# Patient Record
Sex: Female | Born: 2000 | Race: Black or African American | Hispanic: No | Marital: Single | Smoking: Never smoker
Health system: Southern US, Community
[De-identification: ages and names within clinical notes are randomized; demographics above are authoritative.]

---
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
## 2019-04-07 ENCOUNTER — Ambulatory Visit: Payer: Self-pay | Attending: Internal Medicine

## 2019-04-07 DIAGNOSIS — Z23 Encounter for immunization: Secondary | ICD-10-CM

## 2019-04-07 NOTE — Progress Notes (Signed)
   Covid-19 Vaccination Clinic  Name:  Molly Gallagher    MRN: [id number] DOB: [birth year]  04/07/2019  [name] was observed post Covid-19 immunization for 15 minutes without incident. She was provided with Vaccine Information Sheet and instruction to access the V-Safe system.   [name] was instructed to call 911 with any severe reactions post vaccine: Marland Kitchen Difficulty breathing  . Swelling of face and throat  . A fast heartbeat  . A bad rash all over body  . Dizziness and weakness   Immunizations Administered    Name Date Dose VIS Date Route   Pfizer COVID-19 Vaccine 04/07/2019  2:31 PM 0.3 mL 12/18/2018 Intramuscular   Manufacturer: [employer], [employer]   Lot: [id number]   NDC: [address]-2

## 2019-04-09 ENCOUNTER — Ambulatory Visit: Payer: Self-pay

## 2019-05-02 ENCOUNTER — Ambulatory Visit: Payer: Self-pay | Attending: Internal Medicine

## 2019-05-02 DIAGNOSIS — Z23 Encounter for immunization: Secondary | ICD-10-CM

## 2019-05-02 NOTE — Progress Notes (Signed)
   Covid-19 Vaccination Clinic  Name:  Molly Gallagher    MRN: [id number] DOB: 15-Oct-2000  05/02/2019  Molly Gallagher was observed post Covid-19 immunization for 15 minutes without incident. She was provided with Vaccine Information Sheet and instruction to access the V-Safe system.   Molly Gallagher was instructed to call 911 with any severe reactions post vaccine: Marland Kitchen Difficulty breathing  . Swelling of face and throat  . A fast heartbeat  . A bad rash all over body  . Dizziness and weakness   Immunizations Administered    Name Date Dose VIS Date Route   Pfizer COVID-19 Vaccine 05/02/2019  9:48 AM 0.3 mL 03/03/2018 Intramuscular   Manufacturer: [employer], [employer]   Lot: [id number]   NDC: [address]-4
